# Patient Record
Sex: Female | Born: 1990 | Race: White | Hispanic: No | Marital: Married | Smoking: Never smoker
Health system: Southern US, Community
[De-identification: ages and names within clinical notes are randomized; demographics above are authoritative.]

---
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
## 2012-01-21 HISTORY — PX: WISDOM TOOTH EXTRACTION: SHX21

## 2012-10-22 ENCOUNTER — Ambulatory Visit: Payer: Self-pay | Admitting: Family Medicine

## 2012-12-15 ENCOUNTER — Ambulatory Visit (INDEPENDENT_AMBULATORY_CARE_PROVIDER_SITE_OTHER): Payer: BC Managed Care – PPO | Admitting: Family Medicine

## 2012-12-15 ENCOUNTER — Encounter: Payer: Self-pay | Admitting: Family Medicine

## 2012-12-15 VITALS — BP 116/70 | HR 78 | Temp 97.6°F | Resp 20 | Ht 62.5 in | Wt 118.5 lb

## 2012-12-15 DIAGNOSIS — Z23 Encounter for immunization: Secondary | ICD-10-CM

## 2012-12-15 DIAGNOSIS — Z0000 Encounter for general adult medical examination without abnormal findings: Secondary | ICD-10-CM

## 2012-12-15 DIAGNOSIS — L709 Acne, unspecified: Secondary | ICD-10-CM

## 2012-12-15 DIAGNOSIS — L708 Other acne: Secondary | ICD-10-CM

## 2012-12-15 LAB — POCT URINALYSIS DIPSTICK
Bilirubin, UA: NEGATIVE
Blood, UA: NEGATIVE
Glucose, UA: NEGATIVE
Ketones, UA: NEGATIVE
Spec Grav, UA: 1.02
pH, UA: 7

## 2012-12-15 LAB — HEMOGLOBIN A1C
Hgb A1c MFr Bld: 5.2 % (ref <5.7)
Mean Plasma Glucose: 103 mg/dL (ref <117)

## 2012-12-15 MED ORDER — CLINDAMYCIN PHOS-BENZOYL PEROX 1-5 % EX GEL: Freq: Two times a day (BID) | CUTANEOUS | Status: AC

## 2012-12-15 NOTE — Patient Instructions (Addendum)
Health Maintenance, 44- to 20-Year-Old SCHOOL PERFORMANCE After high school completion, the young adult may be attending college, Hotel manager or vocational school, or entering the [employer] or the work force. SOCIAL AND EMOTIONAL DEVELOPMENT The young adult establishes adult relationships and explores sexual identity. Young adults may be living at home or in a college dorm or apartment. Increasing independence is important with young adults. Throughout these years, young adults should assume responsibility of their own health care. RECOMMENDED IMMUNIZATIONS  Influenza vaccine.  All adults should be immunized every year.  All adults, including pregnant women and people with hives-only allergy to eggs can receive the inactivated influenza (IIV) vaccine.  Adults aged 44 49 years can receive the recombinant influenza (RIV) vaccine. The RIV vaccine does not contain any egg protein.  Tetanus, diphtheria, and acellular pertussis (Td, Tdap) vaccine.  Pregnant women should receive 1 dose of Tdap vaccine during each pregnancy. The dose should be obtained regardless of the length of time since the last dose. Immunization is preferred during the 27th to 36th week of gestation.  An adult who has not previously received Tdap or who does not know his or her vaccine status should receive 1 dose of Tdap. This initial dose should be followed by tetanus and diphtheria toxoids (Td) booster doses every 10 years.  Adults with an unknown or incomplete history of completing a 3-dose immunization series with Td-containing vaccines should begin or complete a primary immunization series including a Tdap dose.  Adults should receive a Td booster every 10 years.  Varicella vaccine.  An adult without evidence of immunity to varicella should receive 2 doses or a second dose if he or she has previously received 1 dose.  Pregnant females who do not have evidence of immunity should receive the first dose after pregnancy.  This first dose should be obtained before leaving the health care facility. The second dose should be obtained 4 8 weeks after the first dose.  Human papillomavirus (HPV) vaccine.  Females aged 15 26 years who have not received the vaccine previously should obtain the 3-dose series.  The vaccine is not recommended for use in pregnant females. However, pregnancy testing is not needed before receiving a dose. If a female is found to be pregnant after receiving a dose, no treatment is needed. In that case, the remaining doses should be delayed until after the pregnancy.  Males aged 12 21 years who have not received the vaccine previously should receive the 3-dose series. Males aged 39 26 years may be immunized.  Immunization is recommended through the age of 1 years for any female who has sex with males and did not get any or all doses earlier.  Immunization is recommended for any person with an immunocompromised condition through the age of 27 years if he or she did not get any or all doses earlier.  During the 3-dose series, the second dose should be obtained 4 8 weeks after the first dose. The third dose should be obtained 24 weeks after the first dose and 16 weeks after the second dose.  Measles, mumps, and rubella (MMR) vaccine.  Adults born in [birth year] or later should have 1 or more doses of MMR vaccine unless there is a contraindication to the vaccine or there is laboratory evidence of immunity to each of the three diseases.  A routine second dose of MMR vaccine should be obtained at least 28 days after the first dose for students attending postsecondary schools, health care workers, or international travelers.  For females of childbearing age, rubella immunity should be determined. If there is no evidence of immunity, females who are not pregnant should be vaccinated. If there is no evidence of immunity, females who are pregnant should delay immunization until after pregnancy.  Pneumococcal  13-valent conjugate (PCV13) vaccine.  When indicated, a person who is uncertain of his or her immunization history and has no record of immunization should receive the PCV13 vaccine.  An adult aged 19 years or older who has certain medical conditions and has not been previously immunized should receive 1 dose of PCV13 vaccine. This PCV13 should be followed with a dose of pneumococcal polysaccharide (PPSV23) vaccine. The PPSV23 vaccine dose should be obtained at least 8 weeks after the dose of PCV13 vaccine.  An adult aged 19 years or older who has certain medical conditions and previously received 1 or more doses of PPSV23 vaccine should receive 1 dose of PCV13. The PCV13 vaccine dose should be obtained 1 or more years after the last PPSV23 vaccine dose.  Pneumococcal polysaccharide (PPSV23) vaccine.  When PCV13 is also indicated, PCV13 should be obtained first.  An adult younger than age 65 years who has certain medical conditions should be immunized.  Any person who resides in a nursing home or long-term care facility should be immunized.  An adult smoker should be immunized.  People with an immunocompromised condition and certain other conditions should receive both PCV13 and PPSV23 vaccines.  People with human immunodeficiency virus (HIV) infection should be immunized as soon as possible after diagnosis.  Immunization during chemotherapy or radiation therapy should be avoided.  Routine use of PPSV23 vaccine is not recommended for American Indians, [place] Natives, or people younger than 65 years unless there are medical conditions that require PPSV23 vaccine.  When indicated, people who have unknown immunization and have no record of immunization should receive PPSV23 vaccine.  One-time revaccination 5 years after the first dose of PPSV23 is recommended for people aged 19 64 years who have chronic kidney failure, nephrotic syndrome, asplenia, or immunocompromised  conditions.  Meningococcal vaccine.  Adults with asplenia or persistent complement component deficiencies should receive 2 doses of quadrivalent meningococcal conjugate (MenACWY-D) vaccine. The doses should be obtained at least 2 months apart.  Microbiologists working with certain meningococcal bacteria, military recruits, people at risk during an outbreak, and people who travel to or live in countries with a high rate of meningitis should be immunized.  A first-year college student up through age 21 years who is living in a residence hall should receive a dose if he or she did not receive a dose on or after his or her 16th birthday.  Adults who have certain high-risk conditions should receive one or more doses of vaccine.  Hepatitis A vaccine.  Adults who wish to be protected from this disease, have certain high-risk conditions, work with hepatitis A-infected animals, work in hepatitis A research labs, or travel to or work in countries with a high rate of hepatitis A should be immunized.  Adults who were previously unvaccinated and who anticipate close contact with an international adoptee during the first 60 days after arrival in the [place] from a country with a high rate of hepatitis A should be immunized.  Hepatitis B vaccine.  Adults who wish to be protected from this disease, have certain high-risk conditions, may be exposed to blood or other infectious body fluids, are household contacts or sex partners of hepatitis B positive people, are clients or workers in   certain care facilities, or travel to or work in countries with a high rate of hepatitis B should be immunized.  Haemophilus influenzae type b (Hib) vaccine.  A previously unvaccinated person with asplenia or sickle cell disease or having a scheduled splenectomy should receive 1 dose of Hib vaccine.  Regardless of previous immunization, a recipient of a hematopoietic stem cell transplant should receive a 3-dose series 6  12 months after his or her successful transplant.  Hib vaccine is not recommended for adults with HIV infection. TESTING Annual screening for vision and hearing problems is recommended. Vision should be screened objectively at least once between 18 21 years of age. The young adult may be screened for anemia or tuberculosis. Young adults should have a blood test to check for high cholesterol during this time period. Young adults should be screened for use of alcohol and drugs. If the young adult is sexually active, screening for sexually transmitted infections, pregnancy, or HIV may be performed.  NUTRITION AND ORAL HEALTH  Adequate calcium intake is important. Consume 3 servings of low-fat milk and dairy products daily. For those who do not drink milk or consume dairy products, calcium enriched foods, such as juice, bread, or cereal, dark, leafy greens, or canned fish are alternate sources of calcium.  Drink plenty of water. Limit fruit juice to 8 12 ounces (240 360 mL) each day. Avoid sugary beverages or sodas.  Discourage skipping meals, especially breakfast. Young adults should eat a good variety of vegetables and fruits, as well as lean meats.  Avoid foods high in fat, salt, or sugar, such as candy, chips, and cookies.  Encourage young adults to participate in meal planning and preparation.  Eat meals together as a family whenever possible. Encourage conversation at mealtime.  Limit fast food choices and eating out at restaurants.  Brush teeth twice a day and floss.  Schedule dental exams twice a year. SLEEP Regular sleep habits are important. PHYSICAL, SOCIAL, AND EMOTIONAL DEVELOPMENT  One hour of regular physical activity daily is recommended. Continue to participate in sports.  Encourage young adults to develop their own interests and consider community service or volunteerism.  Provide guidance to the young adult in making decisions about college and work plans.  Make sure  that young adults know that they should never be in a situation that makes them uncomfortable, and they should tell partners if they do not want to engage in sexual activity.  Talk to the young adult about body image. Eating disorders may be noted at this time. Young adults may also be concerned about being overweight. Monitor the young adult for weight gain or loss.  Mood disturbances, depression, anxiety, alcoholism, or attention problems may be noted in young adults. Talk to the caregiver if there are concerns about mental illness.  Negotiate limit setting and independent decision making.  Encourage the young adult to handle conflict without physical violence.  Avoid loud noises which may impair hearing.  Limit television and computer time to 2 hours each day. Individuals who engage in excessive sedentary activity are more likely to become overweight. RISK BEHAVIORS  Sexually active young adults need to take precautions against pregnancy and sexually transmitted infections. Talk to young adults about contraception.  Provide a tobacco-free and drug-free environment for the young adult. Talk to the young adult about drug, tobacco, and alcohol use among friends or at friend's homes. Make sure the young adult knows that smoking tobacco or marijuana and taking drugs have health consequences and   may impact brain development.  Teach the young adult about appropriate use of over-the-counter or prescription medicines.  Establish guidelines for driving and for riding with friends.  Talk to young adults about the risks of drinking and driving or boating. Encourage the young adult to call you if he or she or friends have been drinking or using drugs.  Remind young adults to wear seat belts at all times in cars and life vests in boats.  Young adults should always wear a properly fitted helmet when they are riding a bicycle.  Use caution with all-terrain vehicles (ATVs) or other motorized  vehicles.  Do not keep handguns in the home. (If you do, the gun and ammunition should be locked separately and out of the young adult's access.)  Equip your home with smoke detectors and change the batteries regularly. Make sure all family members know the fire escape plans for your home.  Teach young adults not to swim alone and not to dive in shallow water.  All individuals should wear sunscreen when out in the sun. This minimizes sunburning. WHAT'S NEXT? Young adults should visit their pediatrician or family physician yearly. By young adulthood, health care should be transitioned to a family physician or internal medicine specialist. Sexually active females may want to begin annual physical exams with a gynecologist. Document Released: 04/03/2006 Document Revised: 05/03/2012 Document Reviewed: 04/23/2006 [facility] Patient Information 2014 [place], [place]. Acne  - use a cleanser and moisturizer that says "noncomedogenic" (doesn't make pimples), I suggest cerave or cetaphil. During the day use a moisturizer that has spf in it. If needed, start the benzaclin to control the acne.  Acne is a skin problem that causes pimples. Acne occurs when the pores in your skin get blocked. Your pores may become red, sore, and swollen (inflamed), or infected with a common skin bacterium (Propionibacterium acnes). Acne is a common skin problem. Up to 80% of people get acne at some time. Acne is especially common from the ages of 72 to 70. Acne usually goes away over time with proper treatment. CAUSES  Your pores each contain an oil gland. The oil glands make an oily substance called sebum. Acne happens when these glands get plugged with sebum, dead skin cells, and dirt. The P. acnes bacteria that are normally found in the oil glands then multiply, causing inflammation. Acne is commonly triggered by changes in your hormones. These hormonal changes can cause the oil glands to get bigger and to make more sebum. Factors  that can make acne worse include:  Hormone changes during adolescence.  Hormone changes during women's menstrual cycles.  Hormone changes during pregnancy.  Oil-based cosmetics and hair products.  Harshly scrubbing the skin.  Strong soaps.  Stress.  Hormone problems due to certain diseases.  Long or oily hair rubbing against the skin.  Certain medicines.  Pressure from headbands, backpacks, or shoulder pads.  Exposure to certain oils and chemicals. SYMPTOMS  Acne often occurs on the face, neck, chest, and upper back. Symptoms include:  Small, red bumps (pimples or papules).  Whiteheads (closed comedones).  Blackheads (open comedones).  Small, pus-filled pimples (pustules).  Big, red pimples or pustules that feel tender. More severe acne can cause:  An infected area that contains a collection of pus (abscess).  Hard, painful, fluid-filled sacs (cysts).  Scars. DIAGNOSIS  Your caregiver can usually tell what the problem is by doing a physical exam. TREATMENT  There are many good treatments for acne. Some are available over-the-counter and  some are available with a prescription. The treatment that is best for you depends on the type of acne you have and how severe it is. It may take 2 months of treatment before your acne gets better. Common treatments include:  Creams and lotions that prevent oil glands from clogging.  Creams and lotions that treat or prevent infections and inflammation.  Antibiotics applied to the skin or taken as a pill.  Pills that decrease sebum production.  Birth control pills.  Light or laser treatments.  Minor surgery.  Injections of medicine into the affected areas.  Chemicals that cause peeling of the skin. HOME CARE INSTRUCTIONS  Good skin care is the most important part of treatment.  Wash your skin gently at least twice a day and after exercise. Always wash your skin before bed.  Use mild soap.  After each wash, apply  a water-based skin moisturizer.  Keep your hair clean and off of your face. Shampoo your hair daily.  Only take medicines as directed by your caregiver.  Use a sunscreen or sunblock with SPF 30 or greater. This is especially important when you are using acne medicines.  Choose cosmetics that are noncomedogenic. This means they do not plug the oil glands.  Avoid leaning your chin or forehead on your hands.  Avoid wearing tight headbands or hats.  Avoid picking or squeezing your pimples. This can make your acne worse and cause scarring. SEEK MEDICAL CARE IF:   Your acne is not better after 8 weeks.  Your acne gets worse.  You have a large area of skin that is red or tender. Document Released: 01/04/2000 Document Revised: 03/31/2011 Document Reviewed: 10/25/2010 [facility] Patient Information 2014 [place], [place].

## 2012-12-15 NOTE — Progress Notes (Signed)
   Subjective:    Patient ID: Suzanne Bryant, female    DOB: 1990/11/15, 22 y.o.   MRN: [id number]  HPI Suzanne Bryant is here for health main exam # Health maint - pap - due next year - mammo/pert FH - no FH - physician breast exam - due but pt prefers to defer until next year - c-scope/pert FH - no FH colon cancer - dexa - has not taken prednisone or other steroids in the past - BP screen - today and at goal - cholesterol screen - has not had checked - fasting glucose screen -  Has not had screening - chlamydia screen (if female under age 93) - will screen today, 1 partner in the past - tobacco - none - alcohol - none - drugs - none - Hep C (born 18-1963) - no xfusion or IVDU - tdap (once over age 20 in place of DT) - due - DT (every 10 years until age 20, once age 11+)  - flu - pt concerned abiyt vaccine, will think about - pneumovax - n/a - zostavax (if over 50) - n/a   metrorhagia - periods are irregulasr, on average once every 2 mos and light. This does not bother patient. She doesn't get cramps and as she is not currently sexuallya ctive she isnt worried about being pregnant.   Acne - on her face and chest, not constant but when she breaks out it is significant and she feels embarrassed. No pain, no fevers. She uses mary kay cleanser and lotion but doesn't know if has spf or is noncomedogenic.   Review of Systems no vaginal discharge, fevers, or GI sx     Objective:   Physical Exam  Nursing note and vitals reviewed. Constitutional: She is oriented to person, place, and time. She appears well-developed and well-nourished.  HENT:  Right Ear: External ear normal.  Left Ear: External ear normal.  Nose: Nose normal.  Mouth/Throat: Oropharynx is clear and moist. No oropharyngeal exudate.  Eyes: Conjunctivae are normal. Pupils are equal, round, and reactive to light.  Neck: Normal range of motion. Neck supple. No thyromegaly present.  Cardiovascular: Normal rate, regular  rhythm and normal heart sounds.   Pulmonary/Chest: Effort normal and breath sounds normal.  Abdominal: Soft. Bowel sounds are normal. She exhibits no distension. There is no tenderness. There is no rebound.  Lymphadenopathy:    She has no cervical adenopathy.  Neurological: She is alert and oriented to person, place, and time. She has normal reflexes.  Skin: Skin is warm and dry. some acne, infmamatory and non cystic. Open and closed comedones Psychiatric: She has a normal mood and affect. Her behavior is normal.        Assessment & Plan:  Acne - Plan: clindamycin-benzoyl peroxide (BENZACLIN) gel as ratinoids are not on her insurance plan at anything below tier 3. May consider this if benzaclin doesn't clear her up. Also discussed using noncomedogenics. See avs.   Health maintenance examination - Plan: Vit D  25 hydroxy (rtn osteoporosis monitoring), Varicella zoster antibody, IgG, LDL cholesterol, direct, POCT urinalysis dipstick, GC/chlamydia probe amp, urine, Hemoglobin A1c, Meningococcal conjugate vaccine 4-valent IM, Hepatitis A vaccine pediatric / adolescent 2 dose IM  metrorhagia - pt will let us know if this starts to bother her and we can put her on OCPs. May be more of an issue if at some point she wants to become pregnant. For now will leave it alone.

## 2012-12-16 LAB — VARICELLA ZOSTER ANTIBODY, IGG: Varicella IgG: 15.56 Index (ref <135.00)

## 2012-12-16 LAB — GC/CHLAMYDIA PROBE AMP, URINE: Chlamydia, Swab/Urine, PCR: NEGATIVE

## 2012-12-16 LAB — VITAMIN D 25 HYDROXY (VIT D DEFICIENCY, FRACTURES): Vit D, 25-Hydroxy: 37 ng/mL (ref 30–89)

## 2012-12-20 ENCOUNTER — Telehealth: Payer: Self-pay | Admitting: *Deleted

## 2012-12-20 NOTE — Telephone Encounter (Signed)
Pt notified and appreciative.

## 2012-12-20 NOTE — Telephone Encounter (Signed)
Message copied by [facility], Bonnell Public on Mon Dec 20, 2012  1:36 PM ------      Message from: Acey Lav      Created: Mon Dec 20, 2012 12:14 PM       Please let Naavya know her labs all came back wnl. Thanks AW ------

## 2013-02-17 ENCOUNTER — Encounter: Payer: Self-pay | Admitting: Family Medicine

## 2013-02-17 ENCOUNTER — Ambulatory Visit (INDEPENDENT_AMBULATORY_CARE_PROVIDER_SITE_OTHER): Payer: No Typology Code available for payment source | Admitting: Family Medicine

## 2013-02-17 VITALS — BP 110/66 | HR 118 | Temp 100.0°F | Resp 18 | Ht 62.5 in | Wt 120.5 lb

## 2013-02-17 DIAGNOSIS — J3502 Chronic adenoiditis: Secondary | ICD-10-CM

## 2013-02-17 DIAGNOSIS — R1310 Dysphagia, unspecified: Secondary | ICD-10-CM | POA: Insufficient documentation

## 2013-02-17 DIAGNOSIS — J029 Acute pharyngitis, unspecified: Secondary | ICD-10-CM

## 2013-02-17 LAB — POCT RAPID STREP A (OFFICE): RAPID STREP A SCREEN: NEGATIVE

## 2013-02-17 MED ORDER — CLINDAMYCIN HCL 150 MG PO CAPS: 150.0000 mg | ORAL_CAPSULE | Freq: Three times a day (TID) | ORAL | Status: AC

## 2013-02-17 NOTE — Progress Notes (Signed)
Subjective:     Suzanne Bryant is a 23 y.o. female who presents for evaluation of sore throat. Associated symptoms include nasal blockage, pain while swallowing, post nasal drip, sore throat and swollen glands. Onset of symptoms was 2 days ago, and have been rapidly worsening since that time. She is drinking plenty of fluids. She has not had a recent close exposure to someone with proven streptococcal pharyngitis. 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 She says she was going to class and was walking up 4 flights of stairs. She then noted some sore throat and difficulty swallowing her saliva.  She denies any neck pain when turning. She has some rhinorrhea that's clear.  She's still been eating and drinking despite the sore throat. She has a hx of strep throat infection as a child and a hx of mono when she was in the 9th grade. She has no PMH, no current medicines, no allergies. She is relatively healthy.    The following portions of the patient's history were reviewed and updated as appropriate:  She  has no past medical history on file. She  has past surgical history that includes Wisdom tooth extraction (2014). Current Outpatient Prescriptions on File Prior to Visit  Medication Sig Dispense Refill  . clindamycin-benzoyl peroxide (BENZACLIN) gel Apply topically 2 (two) times daily.  25 g  0   No current facility-administered medications on file prior to visit.   She has No Known Allergies..  Review of Systems Pertinent items are noted in HPI.    Objective:    BP 110/66  Pulse 118  Temp(Src) 100 F (37.8 C) (Temporal)  Resp 18  Ht 5' 2.5" (1.588 m)  Wt 120 lb 8 oz (54.658 kg)  BMI 21.67 kg/m2  SpO2 98%  LMP 02/03/2013 General appearance: alert, cooperative, appears stated age and no distress Head: Normocephalic, without obvious abnormality, atraumatic, sinuses nontender to percussion Eyes: conjunctivae/corneas clear. PERRL, EOM's intact. Fundi benign. Ears: normal TM's and external  ear canals both ears Nose: clear discharge Throat: lips, mucosa, and tongue normal; teeth and gums normal Neck: moderate anterior cervical adenopathy and enlarged adenoid felt to left cervical region. Tender to palpation, no warmth. Lungs: clear to auscultation bilaterally and normal percussion bilaterally Heart: regular rate and rhythm and S1, S2 normal Abdomen: soft, non-tender; bowel sounds normal; no masses,  no organomegaly Extremities: extremities normal, atraumatic, no cyanosis or edema Pulses: 2+ and symmetric Skin: Skin color, texture, turgor normal. No rashes or lesions  Laboratory Strep test done. Results:negative.   Throat culture pending  Assessment:    Acute pharyngitis, likely  Adenoiditis.    Suzanne Bryant was seen today for cough and sore throat.  Diagnoses and associated orders for this visit:  Sore throat - POCT rapid strep A - Throat culture ([facility]) - clindamycin (CLEOCIN) 150 MG capsule; Take 1 capsule (150 mg total) by mouth 3 (three) times daily.  Difficulty in swallowing - clindamycin (CLEOCIN) 150 MG capsule; Take 1 capsule (150 mg total) by mouth 3 (three) times daily.  Adenoiditis - clindamycin (CLEOCIN) 150 MG capsule; Take 1 capsule (150 mg total) by mouth 3 (three) times daily.    Plan:    Patient placed on antibiotics. Patient advised of the risk of peritonsillar abscess formation. Patient advised that he will be infectious for 24 hours after starting antibiotics. Follow up in several days.   To follow up on throat culture and contact her on her cell phone. Note given for school today and tomorrow.

## 2013-02-17 NOTE — Patient Instructions (Signed)
Clindamycin capsules What is this medicine? CLINDAMYCIN (KLIN da MYE sin) is a lincosamide antibiotic. It is used to treat certain kinds of bacterial infections. It will not work for colds, flu, or other viral infections. This medicine may be used for other purposes; ask your health care provider or pharmacist if you have questions. COMMON BRAND NAME(S): Cleocin What should I tell my health care provider before I take this medicine? They need to know if you have any of these conditions: -kidney disease -liver disease -stomach problems like colitis -an unusual or allergic reaction to clindamycin, lincomycin, or other medicines, foods, dyes like tartrazine or preservatives -pregnant or trying to get pregnant -breast-feeding How should I use this medicine? Take this medicine by mouth with a full glass of water. Follow the directions on the prescription label. You can take this medicine with food or on an empty stomach. If the medicine upsets your stomach, take it with food. Take your medicine at regular intervals. Do not take your medicine more often than directed. Take all of your medicine as directed even if you think your are better. Do not skip doses or stop your medicine early. Talk to your pediatrician regarding the use of this medicine in children. Special care may be needed. Overdosage: If you think you have taken too much of this medicine contact a poison control center or emergency room at once. NOTE: This medicine is only for you. Do not share this medicine with others. What if I miss a dose? If you miss a dose, take it as soon as you can. If it is almost time for your next dose, take only that dose. Do not take double or extra doses. What may interact with this medicine? -chloramphenicol -erythromycin -kaolin products This list may not describe all possible interactions. Give your health care provider a list of all the medicines, herbs, non-prescription drugs, or dietary supplements  you use. Also tell them if you smoke, drink alcohol, or use illegal drugs. Some items may interact with your medicine. What should I watch for while using this medicine? Tell your doctor or healthcare professional if your symptoms do not start to get better or if they get worse. Do not treat diarrhea with over the counter products. Contact your doctor if you have diarrhea that lasts more than 2 days or if it is severe and watery. What side effects may I notice from receiving this medicine? Side effects that you should report to your doctor or health care professional as soon as possible: -allergic reactions like skin rash, itching or hives, swelling of the face, lips, or tongue -dark urine -pain on swallowing -redness, blistering, peeling or loosening of the skin, including inside the mouth -unusual bleeding or bruising -unusually weak or tired -yellowing of eyes or skin Side effects that usually do not require medical attention (report to your doctor or health care professional if they continue or are bothersome): -diarrhea -itching in the rectal or genital area -joint pain -nausea, vomiting -stomach pain This list may not describe all possible side effects. Call your doctor for medical advice about side effects. You may report side effects to FDA at 1-800-FDA-1088. Where should I keep my medicine? Keep out of the reach of children. Store at room temperature between 20 and 25 degrees C (68 and 77 degrees F). Throw away any unused medicine after the expiration date. NOTE: This sheet is a summary. It may not cover all possible information. If you have questions about this medicine, talk to  your doctor, pharmacist, or health care provider.  2014, Elsevier/Gold Standard. (2009-05-30 10:12:31) Sore Throat A sore throat is pain, burning, irritation, or scratchiness of the throat. There is often pain or tenderness when swallowing or talking. A sore throat may be accompanied by other symptoms,  such as coughing, sneezing, fever, and swollen neck glands. A sore throat is often the first sign of another sickness, such as a cold, flu, strep throat, or mononucleosis (commonly known as mono). Most sore throats go away without medical treatment. CAUSES  The most common causes of a sore throat include:  A viral infection, such as a cold, flu, or mono.  A bacterial infection, such as strep throat, tonsillitis, or whooping cough.  Seasonal allergies.  Dryness in the air.  Irritants, such as smoke or pollution.  Gastroesophageal reflux disease (GERD). HOME CARE INSTRUCTIONS   Only take over-the-counter medicines as directed by your caregiver.  Drink enough fluids to keep your urine clear or pale yellow.  Rest as needed.  Try using throat sprays, lozenges, or sucking on hard candy to ease any pain (if older than 4 years or as directed).  Sip warm liquids, such as broth, herbal tea, or warm water with honey to relieve pain temporarily. You may also eat or drink cold or frozen liquids such as frozen ice pops.  Gargle with salt water (mix 1 tsp salt with 8 oz of water).  Do not smoke and avoid secondhand smoke.  Put a cool-mist humidifier in your bedroom at night to moisten the air. You can also turn on a hot shower and sit in the bathroom with the door closed for 5 10 minutes. SEEK IMMEDIATE MEDICAL CARE IF:  You have difficulty breathing.  You are unable to swallow fluids, soft foods, or your saliva.  You have increased swelling in the throat.  Your sore throat does not get better in 7 days.  You have nausea and vomiting.  You have a fever or persistent symptoms for more than 2 3 days.  You have a fever and your symptoms suddenly get worse. MAKE SURE YOU:   Understand these instructions.  Will watch your condition.  Will get help right away if you are not doing well or get worse. Document Released: 02/14/2004 Document Revised: 12/24/2011 Document Reviewed:  09/14/2011 [facility] Patient Information 2014 [place], [place]. Enlarged Adenoids Your adenoids are in the back of your nose. They are made up of lymphoid tissue and are part of your immune system. Your immune system helps to prevent and fight infection in your body. Enlarged adenoids can cause snoring, bad breath, and chronic runny nose. More rare, yet serious, complications of enlarged adenoids are pulmonary hypertension, sleep apnea, and right-sided heart failure. Pulmonary hypertension is high blood pressure in the vessels that lead to the lungs. Right-sided heart failure is the failure of the side of the heart which pumps blood to the lungs. RISK FACTORS Risk factors for enlarged adenoids include sinusitis, chronic nasal obstruction, chronic mouth breathing, poor dental maturation, and increased cavities. SYMPTOMS   Dry, cracked lips and mouth.  Restlessness while sleeping.  Snoring.  Bad breath.  Frequent ear infections.  Persistent runny nose or nasal congestion.  Enlarged tonsils. DIAGNOSIS  Your caregiver is often able to diagnose enlarged adenoids by examining your tonsils and your adenoid (done with a special mirror). Occasionally, an X-ray exam of the throat may be done. Sleep studies may be done if you have signs of sleep apnea.  TREATMENT  Antibiotics may  be used to treat bacterial adenoid and sinus infections and reduce swelling of the adenoids. Removal of the adenoids (adenoidectomy) may be recommended in cases of longstanding or recurring bouts of tonsillitis or enlarged adenoids that are causing airway blockage. Because adenoids normally shrink in adolescence, adults very seldom need adenoidectomy.  Other reasons for adenoidectomy may include:  Trouble breathing through the nose.  Excessive snoring.  Repeated or longstanding ear infections that:  Persist despite the use of antibiotics (in cases of bacterial infection).  Recur 5 or more times in a year.  Recur 3 or  more times a year during a 2 year period.  Episodes of no breathing while sleeping (sleep apnea). Document Released: 12/20/2004 Document Revised: 03/31/2011 Document Reviewed: 03/14/2010 [facility] Patient Information 2014 [place], [place].

## 2013-02-19 LAB — CULTURE, GROUP A STREP: Organism ID, Bacteria: NORMAL

## 2013-02-21 ENCOUNTER — Encounter: Payer: Self-pay | Admitting: Family Medicine

## 2013-02-21 ENCOUNTER — Telehealth: Payer: Self-pay | Admitting: Family Medicine

## 2013-02-21 NOTE — Telephone Encounter (Signed)
Yes this is fine. She is on antibiotics and I told her to let me know how she does. Thanks for checking.

## 2013-02-21 NOTE — Telephone Encounter (Signed)
TC from pt asking for school note to cover 02/21/13. Is this ok?

## 2013-03-10 ENCOUNTER — Telehealth: Payer: Self-pay | Admitting: *Deleted

## 2013-03-10 NOTE — Telephone Encounter (Signed)
She had 10 day course of Cleocin. If she got better but sick again, will need to be evaluated before medicine will be sent in to make sure another condition or same condition hasn't worsened from last episode. She shouldn't be sick again with same symptoms.

## 2013-03-10 NOTE — Telephone Encounter (Signed)
Spoke with mom and mom stated that pt is in [place] at school and will be leaving from there to go to [place] and would not be able to make it here to office and back to school in time to be seen.

## 2013-03-10 NOTE — Telephone Encounter (Signed)
Mom called and left VM stating that pt is away at school but was recently in office and seen by MD and treated for sore throat. She state that medication helped pt but now she is showing same symptoms and has a trip tomorrow with school to go to [place] [employer] and mom wants to know if MD could please call in same ABT as before so that she does not have to miss trip or school unless it is ineffective. Will route to MD.

## 2013-03-16 NOTE — Telephone Encounter (Signed)
See note from Dr. Otilio MiuBarrino.
